# Patient Record
Sex: Male | Born: 2000 | Race: Black or African American | Hispanic: No | Marital: Single | Smoking: Never smoker
Health system: Southern US, Community
[De-identification: ages and names within clinical notes are randomized; demographics above are authoritative.]

## PROBLEM LIST (undated history)

## (undated) DIAGNOSIS — R51 Headache: Secondary | ICD-10-CM

## (undated) HISTORY — PX: CIRCUMCISION: SUR203

## (undated) HISTORY — DX: Headache: R51

---
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
## 2018-08-15 ENCOUNTER — Emergency Department: Payer: Medicaid Other

## 2018-08-15 ENCOUNTER — Emergency Department
Admission: EM | Admit: 2018-08-15 | Discharge: 2018-08-15 | Disposition: A | Discharge status: Home / Self Care | Payer: Medicaid Other | Attending: Emergency Medicine | Admitting: Emergency Medicine

## 2018-08-15 ENCOUNTER — Encounter: Payer: Self-pay | Admitting: Emergency Medicine

## 2018-08-15 ENCOUNTER — Other Ambulatory Visit: Payer: Self-pay

## 2018-08-15 DIAGNOSIS — Y999 Unspecified external cause status: Secondary | ICD-10-CM | POA: Insufficient documentation

## 2018-08-15 DIAGNOSIS — S61212A Laceration without foreign body of right middle finger without damage to nail, initial encounter: Secondary | ICD-10-CM | POA: Insufficient documentation

## 2018-08-15 DIAGNOSIS — S6991XA Unspecified injury of right wrist, hand and finger(s), initial encounter: Secondary | ICD-10-CM | POA: Diagnosis present

## 2018-08-15 DIAGNOSIS — W230XXA Caught, crushed, jammed, or pinched between moving objects, initial encounter: Secondary | ICD-10-CM | POA: Insufficient documentation

## 2018-08-15 DIAGNOSIS — Y939 Activity, unspecified: Secondary | ICD-10-CM | POA: Insufficient documentation

## 2018-08-15 DIAGNOSIS — S60031A Contusion of right middle finger without damage to nail, initial encounter: Secondary | ICD-10-CM | POA: Diagnosis not present

## 2018-08-15 DIAGNOSIS — Y929 Unspecified place or not applicable: Secondary | ICD-10-CM | POA: Insufficient documentation

## 2018-08-15 MED ORDER — IBUPROFEN 600 MG PO TABS
600.0000 mg | ORAL_TABLET | Freq: Once | ORAL | Status: AC
  Administered 2018-08-15: 600 mg via ORAL
  Filled 2018-08-15: qty 1

## 2018-08-15 MED ORDER — HYDROCODONE-ACETAMINOPHEN 5-325 MG PO TABS: 1.0000 | ORAL_TABLET | Freq: Four times a day (QID) | ORAL | 0 refills | Status: AC | PRN

## 2018-08-15 MED ORDER — IBUPROFEN 600 MG PO TABS: 600.0000 mg | ORAL_TABLET | Freq: Three times a day (TID) | ORAL | 0 refills | Status: AC | PRN

## 2018-08-15 MED ORDER — HYDROCODONE-ACETAMINOPHEN 5-325 MG PO TABS
1.0000 | ORAL_TABLET | Freq: Once | ORAL | Status: AC
Start: 2018-08-15 — End: 2018-08-15
  Administered 2018-08-15: 1 via ORAL
  Filled 2018-08-15: qty 1

## 2018-08-15 NOTE — Discharge Instructions (Addendum)
1.  You may take pain medicines as needed (Motrin/Norco #15). 2.  Keep wound clean and dry.  You may remove finger splint as needed. 3.  Your tetanus has been updated and will be good for another 10 years. 4.  Return to the ER for worsening symptoms, increased redness/swelling, purulent discharge or other concerns.

## 2018-08-15 NOTE — ED Notes (Signed)
Pt verbalized understanding of d/c instructions, Rx's, and f/u care. No further questions at this time. E-signature not working at this time. Ambulatory to exit with steady gait.

## 2018-08-15 NOTE — ED Triage Notes (Signed)
Patient ambulatory to triage with steady gait, without difficulty or distress noted; pt reports shut right middle finger in door PTA; approx 3/4" lac noted to pad of finger with small amount bleeding

## 2018-08-15 NOTE — ED Notes (Signed)
R finger wound cleansed per order; dressing applied; splint applied per order during down time. See paper chart.

## 2018-08-15 NOTE — ED Provider Notes (Addendum)
-----------------------------------------   12:59 AM on 08/15/2018 -----------------------------------------  Patient seen during epic computer downtime.  Please see paper chart for documentation.   Irean HongSung, Janayah Zavada J, MD 08/15/18 0059   ----------------------------------------- 3:00 AM on 08/15/2018 -----------------------------------------  Delay secondary to computer downtime.  X-ray negative for acute fracture.  Strict return precautions given.  Patient verbalizes understanding and agrees with plan of care.   Irean HongSung, Montrel Donahoe J, MD 08/15/18 [phone]

## 2019-08-19 ENCOUNTER — Inpatient Hospital Stay (HOSPITAL_COMMUNITY): Payer: Medicaid Other | Admitting: Anesthesiology

## 2019-08-19 ENCOUNTER — Encounter (HOSPITAL_COMMUNITY): Admission: EM | Disposition: A | Discharge status: Home / Self Care | Payer: Self-pay | Source: Home / Self Care

## 2019-08-19 ENCOUNTER — Other Ambulatory Visit: Payer: Self-pay

## 2019-08-19 ENCOUNTER — Emergency Department (HOSPITAL_COMMUNITY): Payer: Medicaid Other

## 2019-08-19 ENCOUNTER — Inpatient Hospital Stay (HOSPITAL_COMMUNITY)
Admission: EM | Admit: 2019-08-19 | Discharge: 2019-08-25 | DRG: 958 | Disposition: A | Discharge status: Home / Self Care | Payer: Medicaid Other | Attending: Surgery | Admitting: Surgery

## 2019-08-19 ENCOUNTER — Encounter (HOSPITAL_COMMUNITY): Payer: Self-pay

## 2019-08-19 ENCOUNTER — Inpatient Hospital Stay (HOSPITAL_COMMUNITY): Payer: Medicaid Other

## 2019-08-19 DIAGNOSIS — D62 Acute posthemorrhagic anemia: Secondary | ICD-10-CM | POA: Diagnosis present

## 2019-08-19 DIAGNOSIS — Z23 Encounter for immunization: Secondary | ICD-10-CM

## 2019-08-19 DIAGNOSIS — S143XXA Injury of brachial plexus, initial encounter: Secondary | ICD-10-CM | POA: Diagnosis present

## 2019-08-19 DIAGNOSIS — J9690 Respiratory failure, unspecified, unspecified whether with hypoxia or hypercapnia: Secondary | ICD-10-CM

## 2019-08-19 DIAGNOSIS — S21132A Puncture wound without foreign body of left front wall of thorax without penetration into thoracic cavity, initial encounter: Secondary | ICD-10-CM | POA: Diagnosis present

## 2019-08-19 DIAGNOSIS — S271XXA Traumatic hemothorax, initial encounter: Secondary | ICD-10-CM

## 2019-08-19 DIAGNOSIS — Z4682 Encounter for fitting and adjustment of non-vascular catheter: Secondary | ICD-10-CM

## 2019-08-19 DIAGNOSIS — Z20828 Contact with and (suspected) exposure to other viral communicable diseases: Secondary | ICD-10-CM | POA: Diagnosis present

## 2019-08-19 DIAGNOSIS — S2242XA Multiple fractures of ribs, left side, initial encounter for closed fracture: Secondary | ICD-10-CM | POA: Diagnosis present

## 2019-08-19 DIAGNOSIS — S81032A Puncture wound without foreign body, left knee, initial encounter: Secondary | ICD-10-CM | POA: Diagnosis present

## 2019-08-19 DIAGNOSIS — J939 Pneumothorax, unspecified: Secondary | ICD-10-CM

## 2019-08-19 DIAGNOSIS — S27391A Other injuries of lung, unilateral, initial encounter: Secondary | ICD-10-CM

## 2019-08-19 DIAGNOSIS — S3133XA Puncture wound without foreign body of scrotum and testes, initial encounter: Secondary | ICD-10-CM | POA: Diagnosis present

## 2019-08-19 DIAGNOSIS — M25522 Pain in left elbow: Secondary | ICD-10-CM

## 2019-08-19 DIAGNOSIS — S272XXA Traumatic hemopneumothorax, initial encounter: Secondary | ICD-10-CM | POA: Diagnosis present

## 2019-08-19 DIAGNOSIS — S1193XA Puncture wound without foreign body of unspecified part of neck, initial encounter: Secondary | ICD-10-CM | POA: Diagnosis present

## 2019-08-19 DIAGNOSIS — T1490XA Injury, unspecified, initial encounter: Secondary | ICD-10-CM | POA: Diagnosis present

## 2019-08-19 DIAGNOSIS — J942 Hemothorax: Secondary | ICD-10-CM

## 2019-08-19 DIAGNOSIS — S71131A Puncture wound without foreign body, right thigh, initial encounter: Secondary | ICD-10-CM

## 2019-08-19 DIAGNOSIS — W3400XA Accidental discharge from unspecified firearms or gun, initial encounter: Secondary | ICD-10-CM

## 2019-08-19 DIAGNOSIS — Z9689 Presence of other specified functional implants: Secondary | ICD-10-CM

## 2019-08-19 HISTORY — PX: SCROTAL EXPLORATION: SHX2386

## 2019-08-19 HISTORY — PX: CYSTOSCOPY: SHX5120

## 2019-08-19 HISTORY — PX: CHEST TUBE INSERTION: SHX231

## 2019-08-19 HISTORY — PX: ORCHIECTOMY: SHX2116

## 2019-08-19 LAB — CBC
HCT: 30 % — ABNORMAL LOW (ref 39.0–52.0)
HCT: 31.7 % — ABNORMAL LOW (ref 39.0–52.0)
Hemoglobin: 10 g/dL — ABNORMAL LOW (ref 13.0–17.0)
Hemoglobin: 11.2 g/dL — ABNORMAL LOW (ref 13.0–17.0)
MCH: 31.1 pg (ref 26.0–34.0)
MCH: 30.9 pg (ref 26.0–34.0)
MCHC: 33.3 g/dL (ref 30.0–36.0)
MCHC: 35.3 g/dL (ref 30.0–36.0)
MCV: 93.2 fL (ref 80.0–100.0)
MCV: 87.3 fL (ref 80.0–100.0)
Platelets: 160 10*3/uL (ref 150–400)
Platelets: 142 10*3/uL — ABNORMAL LOW (ref 150–400)
RBC: 3.22 MIL/uL — ABNORMAL LOW (ref 4.22–5.81)
RBC: 3.63 MIL/uL — ABNORMAL LOW (ref 4.22–5.81)
RDW: 13 % (ref 11.5–15.5)
RDW: 13.1 % (ref 11.5–15.5)
WBC: 15 10*3/uL — ABNORMAL HIGH (ref 4.0–10.5)
WBC: 9.1 10*3/uL (ref 4.0–10.5)
nRBC: 0 % (ref 0.0–0.2)
nRBC: 0 % (ref 0.0–0.2)

## 2019-08-19 LAB — I-STAT CHEM 8, ED
BUN: 16 mg/dL (ref 6–20)
Calcium, Ion: 0.8 mmol/L — CL (ref 1.15–1.40)
Chloride: 106 mmol/L (ref 98–111)
Creatinine, Ser: 0.7 mg/dL (ref 0.61–1.24)
Glucose, Bld: 151 mg/dL — ABNORMAL HIGH (ref 70–99)
HCT: 27 % — ABNORMAL LOW (ref 39.0–52.0)
Hemoglobin: 9.2 g/dL — ABNORMAL LOW (ref 13.0–17.0)
Potassium: 3.3 mmol/L — ABNORMAL LOW (ref 3.5–5.1)
Sodium: 143 mmol/L (ref 135–145)
TCO2: 20 mmol/L — ABNORMAL LOW (ref 22–32)

## 2019-08-19 LAB — COMPREHENSIVE METABOLIC PANEL
ALT: 10 U/L (ref 0–44)
AST: 17 U/L (ref 15–41)
Albumin: 2.5 g/dL — ABNORMAL LOW (ref 3.5–5.0)
Alkaline Phosphatase: 37 U/L — ABNORMAL LOW (ref 38–126)
Anion gap: 21 — ABNORMAL HIGH (ref 5–15)
BUN: 15 mg/dL (ref 6–20)
CO2: 8 mmol/L — ABNORMAL LOW (ref 22–32)
Calcium: 7.2 mg/dL — ABNORMAL LOW (ref 8.9–10.3)
Chloride: 113 mmol/L — ABNORMAL HIGH (ref 98–111)
Creatinine, Ser: 0.86 mg/dL (ref 0.61–1.24)
GFR calc Af Amer: >60 mL/min (ref >60)
GFR calc non Af Amer: >60 mL/min (ref >60)
Glucose, Bld: 155 mg/dL — ABNORMAL HIGH (ref 70–99)
Potassium: 3.4 mmol/L — ABNORMAL LOW (ref 3.5–5.1)
Sodium: 142 mmol/L (ref 135–145)
Total Bilirubin: 0.7 mg/dL (ref 0.3–1.2)
Total Protein: 4 g/dL — ABNORMAL LOW (ref 6.5–8.1)

## 2019-08-19 LAB — URINALYSIS, ROUTINE W REFLEX MICROSCOPIC
Bacteria, UA: NONE SEEN
Bilirubin Urine: NEGATIVE
Glucose, UA: NEGATIVE mg/dL
Ketones, ur: NEGATIVE mg/dL
Leukocytes,Ua: NEGATIVE
Nitrite: NEGATIVE
Protein, ur: NEGATIVE mg/dL
Specific Gravity, Urine: 1.029 (ref 1.005–1.030)
pH: 5 (ref 5.0–8.0)

## 2019-08-19 LAB — POCT I-STAT 7, (LYTES, BLD GAS, ICA,H+H)
Acid-base deficit: 1 mmol/L (ref 0.0–2.0)
Bicarbonate: 24.7 mmol/L (ref 20.0–28.0)
Calcium, Ion: 1.2 mmol/L (ref 1.15–1.40)
HCT: 29 % — ABNORMAL LOW (ref 39.0–52.0)
Hemoglobin: 9.9 g/dL — ABNORMAL LOW (ref 13.0–17.0)
O2 Saturation: 100 %
Patient temperature: 35
Potassium: 4 mmol/L (ref 3.5–5.1)
Sodium: 140 mmol/L (ref 135–145)
TCO2: 26 mmol/L (ref 22–32)
pCO2 arterial: 40 mmHg (ref 32.0–48.0)
pH, Arterial: 7.389 (ref 7.350–7.450)
pO2, Arterial: 547 mmHg — ABNORMAL HIGH (ref 83.0–108.0)

## 2019-08-19 LAB — PROTIME-INR
INR: 1.3 — ABNORMAL HIGH (ref 0.8–1.2)
Prothrombin Time: 16.3 seconds — ABNORMAL HIGH (ref 11.4–15.2)

## 2019-08-19 LAB — ABO/RH: ABO/RH(D): B POS

## 2019-08-19 LAB — CDS SEROLOGY

## 2019-08-19 LAB — ETHANOL: Alcohol, Ethyl (B): <10 mg/dL (ref <10)

## 2019-08-19 LAB — RESPIRATORY PANEL BY RT PCR (FLU A&B, COVID)
Influenza A by PCR: NEGATIVE
Influenza B by PCR: NEGATIVE
SARS Coronavirus 2 by RT PCR: NEGATIVE

## 2019-08-19 LAB — LACTIC ACID, PLASMA: Lactic Acid, Venous: 3.7 mmol/L (ref 0.5–1.9)

## 2019-08-19 LAB — HIV ANTIBODY (ROUTINE TESTING W REFLEX): HIV Screen 4th Generation wRfx: NONREACTIVE

## 2019-08-19 LAB — MRSA PCR SCREENING: MRSA by PCR: NEGATIVE

## 2019-08-19 SURGERY — EXPLORATION, SCROTUM
Anesthesia: General | Site: Scrotum | Laterality: Right

## 2019-08-19 SURGERY — EXCISION, CYST, NECK
Anesthesia: General | Site: Neck | Laterality: Left

## 2019-08-19 MED ORDER — FENTANYL CITRATE (PF) 100 MCG/2ML IJ SOLN: 25.0000 ug | INTRAMUSCULAR | Status: DC | PRN

## 2019-08-19 MED ORDER — FENTANYL CITRATE (PF) 100 MCG/2ML IJ SOLN
INTRAMUSCULAR | Status: AC | PRN
  Administered 2019-08-19: 50 ug via INTRAVENOUS

## 2019-08-19 MED ORDER — MIDAZOLAM HCL 2 MG/2ML IJ SOLN
INTRAMUSCULAR | Status: AC
  Filled 2019-08-19: qty 2

## 2019-08-19 MED ORDER — SODIUM CHLORIDE 0.45 % IV SOLN: INTRAVENOUS | Status: DC

## 2019-08-19 MED ORDER — MIDAZOLAM HCL 5 MG/5ML IJ SOLN
INTRAMUSCULAR | Status: DC | PRN
  Administered 2019-08-19: 2 mg via INTRAVENOUS

## 2019-08-19 MED ORDER — SUCCINYLCHOLINE CHLORIDE 200 MG/10ML IV SOSY
PREFILLED_SYRINGE | INTRAVENOUS | Status: DC | PRN
  Administered 2019-08-19: 80 mg via INTRAVENOUS

## 2019-08-19 MED ORDER — DOCUSATE SODIUM 100 MG PO CAPS
100.0000 mg | ORAL_CAPSULE | Freq: Two times a day (BID) | ORAL | Status: DC
  Administered 2019-08-19 – 2019-08-25 (×12): 100 mg via ORAL
  Filled 2019-08-19 (×12): qty 1

## 2019-08-19 MED ORDER — MORPHINE SULFATE (PF) 2 MG/ML IV SOLN
2.0000 mg | INTRAVENOUS | Status: DC | PRN
  Administered 2019-08-19 – 2019-08-20 (×2): 2 mg via INTRAVENOUS
  Filled 2019-08-19 (×2): qty 1

## 2019-08-19 MED ORDER — FENTANYL CITRATE (PF) 100 MCG/2ML IJ SOLN
INTRAMUSCULAR | Status: AC
  Filled 2019-08-19: qty 2

## 2019-08-19 MED ORDER — CEFAZOLIN SODIUM-DEXTROSE 2-4 GM/100ML-% IV SOLN
2.0000 g | Freq: Once | INTRAVENOUS | Status: AC
  Administered 2019-08-19: 2 g via INTRAVENOUS

## 2019-08-19 MED ORDER — PROPOFOL 10 MG/ML IV BOLUS
INTRAVENOUS | Status: DC | PRN
  Administered 2019-08-19: 120 mg via INTRAVENOUS

## 2019-08-19 MED ORDER — FENTANYL CITRATE (PF) 250 MCG/5ML IJ SOLN
INTRAMUSCULAR | Status: AC
  Filled 2019-08-19: qty 5

## 2019-08-19 MED ORDER — IOHEXOL 350 MG/ML SOLN
100.0000 mL | Freq: Once | INTRAVENOUS | Status: AC | PRN
  Administered 2019-08-19: 100 mL via INTRAVENOUS

## 2019-08-19 MED ORDER — DEXAMETHASONE SODIUM PHOSPHATE 10 MG/ML IJ SOLN
INTRAMUSCULAR | Status: DC | PRN
  Administered 2019-08-19: 4 mg via INTRAVENOUS

## 2019-08-19 MED ORDER — METHOCARBAMOL 750 MG PO TABS
750.0000 mg | ORAL_TABLET | Freq: Three times a day (TID) | ORAL | Status: DC | PRN
  Administered 2019-08-19: 750 mg via ORAL
  Filled 2019-08-19: qty 1

## 2019-08-19 MED ORDER — LACTATED RINGERS IV SOLN: INTRAVENOUS | Status: DC | PRN

## 2019-08-19 MED ORDER — CALCIUM GLUCONATE-NACL 2-0.675 GM/100ML-% IV SOLN
2.0000 g | Freq: Once | INTRAVENOUS | Status: AC
  Administered 2019-08-19: 2000 mg via INTRAVENOUS
  Filled 2019-08-19: qty 100

## 2019-08-19 MED ORDER — LIDOCAINE HCL (PF) 2 % IJ SOLN
INTRAMUSCULAR | Status: AC
  Filled 2019-08-19: qty 5

## 2019-08-19 MED ORDER — ROCURONIUM BROMIDE 10 MG/ML (PF) SYRINGE
PREFILLED_SYRINGE | INTRAVENOUS | Status: DC | PRN
  Administered 2019-08-19: 50 mg via INTRAVENOUS

## 2019-08-19 MED ORDER — FENTANYL CITRATE (PF) 250 MCG/5ML IJ SOLN
INTRAMUSCULAR | Status: DC | PRN
  Administered 2019-08-19 (×4): 50 ug via INTRAVENOUS
  Administered 2019-08-19: 100 ug via INTRAVENOUS

## 2019-08-19 MED ORDER — ONDANSETRON HCL 4 MG/2ML IJ SOLN
INTRAMUSCULAR | Status: DC | PRN
  Administered 2019-08-19: 4 mg via INTRAVENOUS

## 2019-08-19 MED ORDER — OXYCODONE HCL 5 MG/5ML PO SOLN: 5.0000 mg | ORAL | Status: DC | PRN

## 2019-08-19 MED ORDER — 0.9 % SODIUM CHLORIDE (POUR BTL) OPTIME
TOPICAL | Status: DC | PRN
  Administered 2019-08-19: 1000 mL

## 2019-08-19 MED ORDER — PROMETHAZINE HCL 25 MG/ML IJ SOLN: 6.2500 mg | INTRAMUSCULAR | Status: DC | PRN

## 2019-08-19 MED ORDER — STERILE WATER FOR IRRIGATION IR SOLN
Status: DC | PRN
  Administered 2019-08-19 (×2): 3000 mL

## 2019-08-19 MED ORDER — ACETAMINOPHEN 325 MG PO TABS: 650.0000 mg | ORAL_TABLET | Freq: Four times a day (QID) | ORAL | Status: DC | PRN

## 2019-08-19 MED ORDER — ONDANSETRON HCL 4 MG/2ML IJ SOLN: 4.0000 mg | Freq: Four times a day (QID) | INTRAMUSCULAR | Status: DC | PRN

## 2019-08-19 MED ORDER — CEFAZOLIN SODIUM-DEXTROSE 2-3 GM-%(50ML) IV SOLR
INTRAVENOUS | Status: DC | PRN
  Administered 2019-08-19: 2 g via INTRAVENOUS

## 2019-08-19 MED ORDER — SODIUM CHLORIDE 0.9 % IV SOLN: INTRAVENOUS | Status: DC | PRN

## 2019-08-19 MED ORDER — TETANUS-DIPHTH-ACELL PERTUSSIS 5-2.5-18.5 LF-MCG/0.5 IM SUSP
0.5000 mL | Freq: Once | INTRAMUSCULAR | Status: AC
  Administered 2019-08-19: 0.5 mL via INTRAMUSCULAR

## 2019-08-19 MED ORDER — DEXMEDETOMIDINE HCL 200 MCG/2ML IV SOLN
INTRAVENOUS | Status: DC | PRN
  Administered 2019-08-19 (×2): 20 ug via INTRAVENOUS

## 2019-08-19 MED ORDER — ONDANSETRON 4 MG PO TBDP: 4.0000 mg | ORAL_TABLET | Freq: Four times a day (QID) | ORAL | Status: DC | PRN

## 2019-08-19 MED ORDER — SUGAMMADEX SODIUM 200 MG/2ML IV SOLN
INTRAVENOUS | Status: DC | PRN
  Administered 2019-08-19: 20 mg via INTRAVENOUS

## 2019-08-19 MED ORDER — PROPOFOL 10 MG/ML IV BOLUS
INTRAVENOUS | Status: AC
  Filled 2019-08-19: qty 40

## 2019-08-19 MED ORDER — FENTANYL CITRATE (PF) 100 MCG/2ML IJ SOLN
100.0000 ug | Freq: Once | INTRAMUSCULAR | Status: AC
  Administered 2019-08-19: 100 ug via INTRAVENOUS
  Filled 2019-08-19: qty 2

## 2019-08-19 MED ORDER — CHLORHEXIDINE GLUCONATE CLOTH 2 % EX PADS
6.0000 | MEDICATED_PAD | Freq: Every day | CUTANEOUS | Status: DC
  Administered 2019-08-19 – 2019-08-24 (×6): 6 via TOPICAL

## 2019-08-19 MED ORDER — ALBUMIN HUMAN 5 % IV SOLN: INTRAVENOUS | Status: DC | PRN

## 2019-08-19 MED ORDER — POTASSIUM CHLORIDE IN NACL 20-0.45 MEQ/L-% IV SOLN
INTRAVENOUS | Status: DC
  Filled 2019-08-19 (×3): qty 1000

## 2019-08-19 MED ORDER — LIDOCAINE 2% (20 MG/ML) 5 ML SYRINGE
INTRAMUSCULAR | Status: DC | PRN
  Administered 2019-08-19: 60 mg via INTRAVENOUS

## 2019-08-19 MED ORDER — OXYCODONE HCL 5 MG PO TABS
5.0000 mg | ORAL_TABLET | ORAL | Status: DC | PRN
  Administered 2019-08-19: 5 mg via ORAL
  Administered 2019-08-19: 10 mg via ORAL
  Administered 2019-08-19: 5 mg via ORAL
  Administered 2019-08-20 – 2019-08-21 (×7): 10 mg via ORAL
  Administered 2019-08-22: 5 mg via ORAL
  Administered 2019-08-22: 10 mg via ORAL
  Filled 2019-08-19 (×8): qty 2
  Filled 2019-08-19 (×2): qty 1
  Filled 2019-08-19 (×3): qty 2

## 2019-08-19 MED ORDER — POTASSIUM CHLORIDE 10 MEQ/100ML IV SOLN
10.0000 meq | INTRAVENOUS | Status: AC
  Administered 2019-08-19 (×3): 10 meq via INTRAVENOUS
  Filled 2019-08-19 (×3): qty 100

## 2019-08-19 SURGICAL SUPPLY — 34 items
BAG URO CATCHER STRL LF (MISCELLANEOUS) IMPLANT
BLADE SURG 15 STRL LF DISP TIS (BLADE) ×6 IMPLANT
BLADE SURG 15 STRL SS (BLADE) ×4
CANISTER SUCT 3000ML PPV (MISCELLANEOUS) ×5 IMPLANT
CONT SPEC 4OZ CLIKSEAL STRL BL (MISCELLANEOUS) ×5 IMPLANT
COVER WAND RF STERILE (DRAPES) ×5 IMPLANT
DRAIN PENROSE 18X1/4 LTX STRL (DRAIN) ×5 IMPLANT
DRAPE LAPAROTOMY 100X72 PEDS (DRAPES) ×5 IMPLANT
DRSG PAD ABDOMINAL 8X10 ST (GAUZE/BANDAGES/DRESSINGS) ×10 IMPLANT
DRSG XEROFORM 1X8 (GAUZE/BANDAGES/DRESSINGS) ×10 IMPLANT
ELECT REM PT RETURN 9FT ADLT (ELECTROSURGICAL) ×5
ELECTRODE REM PT RTRN 9FT ADLT (ELECTROSURGICAL) ×3 IMPLANT
GAUZE SPONGE 4X4 12PLY STRL (GAUZE/BANDAGES/DRESSINGS) ×15 IMPLANT
GLOVE ORTHO TXT STRL SZ7.5 (GLOVE) ×5 IMPLANT
KIT BASIN OR (CUSTOM PROCEDURE TRAY) ×5 IMPLANT
KIT TURNOVER KIT B (KITS) ×5 IMPLANT
NS IRRIG 1000ML POUR BTL (IV SOLUTION) ×5 IMPLANT
PACK GENERAL/GYN (CUSTOM PROCEDURE TRAY) ×5 IMPLANT
PACK LITHOTOMY IV (CUSTOM PROCEDURE TRAY) ×5 IMPLANT
PAD ARMBOARD 7.5X6 YLW CONV (MISCELLANEOUS) ×10 IMPLANT
SET CYSTO W/LG BORE CLAMP LF (SET/KITS/TRAYS/PACK) ×5 IMPLANT
SOL PREP POV-IOD 4OZ 10% (MISCELLANEOUS) ×5 IMPLANT
SPONGE LAP 18X18 RF (DISPOSABLE) IMPLANT
SUT CHROMIC 3 0 SH 27 (SUTURE) ×5 IMPLANT
SUT VIC AB 0 CT2 27 (SUTURE) ×10 IMPLANT
SWAB COLLECTION DEVICE MRSA (MISCELLANEOUS) IMPLANT
SYSTEM SAHARA CHEST DRAIN ATS (WOUND CARE) ×5 IMPLANT
TAPE CLOTH SURG 6X10 WHT LF (GAUZE/BANDAGES/DRESSINGS) ×5 IMPLANT
TOWEL GREEN STERILE (TOWEL DISPOSABLE) ×5 IMPLANT
TOWEL GREEN STERILE FF (TOWEL DISPOSABLE) ×5 IMPLANT
TRAY CHEST TUBE INSERTION DISP (SET/KITS/TRAYS/PACK) ×5 IMPLANT
TUBE CONNECTING 20'X1/4 (TUBING) ×2
TUBE CONNECTING 20X1/4 (TUBING) ×8 IMPLANT
WATER STERILE IRR 1000ML POUR (IV SOLUTION) ×5 IMPLANT

## 2019-08-19 NOTE — Op Note (Signed)
   Procedure Note  Date: 08/19/2019  Procedure: tube thoracostomy--left    Pre-op diagnosis: left retained hemothorax  Post-op diagnosis: same  Surgeon: Jesusita Oka, MD  Anesthesia: general  EBL: <5cc procedural; 40cc blood evacuated Drains/Implants: 79F chest tube Specimen: none  Description of procedure: Time-out was performed verifying correct patient, procedure, site, laterality, and signature of informed consent. A longitudinal incision was made parallel to the rib at the fifth intercostal space. This incision was deepened down through the muscle until the pleural cavity was entered. Blood was encountered upon entry and a 79F chest tube was inserted through this tract and directed inferiorly.   The tube was secured at 12cm at the skin with suture and connected to an atrium at -20cm water wall suction. Immediate output from the chest tube was 40cc and was bloody. The site was dressed with xeroform, gauze, and tape. The patient tolerated the procedure well. There were no complications. Follow up chest x-ray was ordered to confirm tube positioning, complete evacuation, and complete lung re-expansion.    Jesusita Oka, MD General and [facility]

## 2019-08-19 NOTE — Anesthesia Procedure Notes (Signed)
Arterial Line Insertion Start/End12/15/2020 5:07 AM, 08/19/2019 5:09 AM Performed by: Jearld Pies, CRNA, CRNA  Patient location: OR. Emergency situation Left, radial was placed Catheter size: 20 G Hand hygiene performed  and Seldinger technique used Allen's test indicative of satisfactory collateral circulation Attempts: 1 Procedure performed without using ultrasound guided technique. Following insertion, dressing applied and Biopatch. Post procedure assessment: normal  Patient tolerated the procedure well with no immediate complications.

## 2019-08-19 NOTE — ED Notes (Signed)
BP Improved, BLT Le pulses present at this time per MD [facility]

## 2019-08-19 NOTE — Discharge Instructions (Addendum)
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 underlying lung problem.  You smoke.  You are 33-45 years old, male, tall, and underweight.  You have a personal or family history of pneumothorax.  You have an eating disorder (anorexia nervosa). This condition can also happen quickly, even in people with no history of lung problems. What are the signs or symptoms? Sometimes a pneumothorax will have no symptoms. When symptoms are present, they can include:  Chest pain.  Shortness of breath.  Increased rate of breathing.  Bluish color to your lips or skin (cyanosis). How is this diagnosed? This condition may be diagnosed by:  A medical history and physical exam.  A chest X-ray, chest CT scan,  or  ultrasound. How is this treated? Treatment depends on how severe your condition is. The goal of treatment is to remove the extra air and allow your lung to expand back to its normal size.  For a small pneumothorax: ? No treatment may be needed. ? Extra oxygen is sometimes used to make it go away more quickly.  For a large pneumothorax or a pneumothorax that is causing symptoms, a procedure is done to drain the air from your lungs. To do this, a health care provider may use: ? A needle with a syringe. This is used to suck air from a pleural space where no additional leakage is taking place. ? A chest tube. This is used to suck air where there is ongoing leakage into the pleural space. The chest tube may need to remain in place for several days until the air leak has healed.  In more severe cases, surgery may be needed to repair the damage that is causing the leak.  If you have multiple pneumothorax episodes or have an air leak that will not heal, a procedure called a pleurodesis may be done. A medicine is placed in the pleural space to irritate the tissues around the lung so that the lung will stick to the chest wall, seal any leaks, and stop any buildup of air in that space. If you have an underlying lung problem, severe symptoms, or a large pneumothorax you will usually need to stay in the hospital. Follow these instructions at home: Lifestyle  Do not use any products that contain nicotine or tobacco, such as cigarettes and e-cigarettes. These are major risk factors in pneumothorax. If you need help quitting, ask your health care provider.  Do not lift anything that is heavier than 10 lb (4.5 kg), or the limit that your health care provider tells you, until he or she says that it is safe.  Avoid activities that take a lot of effort (strenuous) for as long as told by your health care provider.  Return to your normal activities as told by your health care provider. Ask your health care  provider what activities are safe for you.  Do not fly in an airplane or scuba dive until your health care provider says it is okay. General instructions  Take over-the-counter and prescription medicines only as told by your health care provider.  If a cough or pain makes it difficult for you to sleep at night, try sleeping in a semi-upright position in a recliner or by using 2 or 3 pillows.  If you had a chest tube and it was removed, ask your health care provider when you can remove the bandage (dressing). While the dressing is in place, do not allow it to get wet.  Keep all follow-up visits as told by your health care provider. This is important. Contact a health care provider if:  You cough up thick mucus (sputum) that is yellow or green in color.  You were treated with a chest tube, and you have redness, increasing pain, or discharge at the site where it was placed. Get help right away if:  You have increasing chest pain or shortness of breath.  You have a cough that will not go away.  You begin coughing up blood.  You have pain that is getting worse or is not controlled with medicines.  The site where your chest tube was located opens up.  You feel air coming out of the site where the chest tube was placed.  You have a fever or persistent symptoms for more than 2-3 days.  You have a fever and your symptoms suddenly get worse. These symptoms may represent a serious problem that is an emergency. Do not wait to see if the symptoms will go away. Get medical help right away. Call your local emergency services (911 in the [place] Do not drive yourself to the hospital. Summary  A pneumothorax, commonly called a collapsed lung, is a condition in which air leaks from a lung and gets trapped between the lung and the chest wall (pleural space).  The buildup of air may be small or large. A small pneumothorax may go away on its own. A large pneumothorax will require treatment and  hospitalization.  Treatment for this condition depends on how severe the pneumothorax is. The goal of treatment is to remove the extra air and allow the lung to expand back to its normal size. This information is not intended to replace advice given to you by your health care provider. Make sure you discuss any questions you have with your health care provider. Document Released: 08/21/2005 Document Revised: 07/30/2017 Document Reviewed: 07/30/2017 Elsevier Interactive Patient Education  [employer].  Please make sure to keep your appointments with occupational therapy to help strengthen your left arm. If this does not improve in 12 weeks then you may need to see a specialist (Dr. Dierdre Searles at [facility])
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
## 2019-08-19 NOTE — ED Notes (Signed)
2G ANCEF INFUSING
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
## 2019-08-19 NOTE — Anesthesia Procedure Notes (Signed)
Procedure Name: Intubation Date/Time: 08/19/2019 5:09 AM Performed by: Jearld Pies, CRNA Pre-anesthesia Checklist: Patient identified, Emergency Drugs available, Suction available and Patient being monitored Patient Re-evaluated:Patient Re-evaluated prior to induction Oxygen Delivery Method: Circle System Utilized Preoxygenation: Pre-oxygenation with 100% oxygen Induction Type: IV induction and Rapid sequence Laryngoscope Size: Glidescope and 3 Grade View: Grade I Tube type: Oral Tube size: 8.0 mm Number of attempts: 1 Airway Equipment and Method: Stylet and Oral airway Placement Confirmation: ETT inserted through vocal cords under direct vision,  positive ETCO2 and breath sounds checked- equal and bilateral Secured at: 23 cm Tube secured with: Tape Dental Injury: Teeth and Oropharynx as per pre-operative assessment  Comments: Glidescope utilized d/t C-spine precautions. Aspen collar maintained during intubation.
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
## 2019-08-19 NOTE — ED Notes (Signed)
PT arrives w/ pressure 60 palp, unobtainable doppler pulses in blt lower extremities per Lovick.

## 2019-08-19 NOTE — ED Notes (Signed)
MD Lovick performinhg chest tube to L side at this time for PNX
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
## 2019-08-19 NOTE — Progress Notes (Signed)
I responded to a request from the nurse to provide spiritual support for the patient's family. I was present with the medical staff member when she gave medical updates to the family. I shared words of comfort and encouragement. I shared that the Chaplain is available to provide additional support as needed or requested.    08/19/19 0400  Clinical Encounter Type  Visited With Family;Patient not available  Visit Type Spiritual support;ED;Trauma  Referral From Nurse  Consult/Referral To Chaplain  Spiritual Encounters  Spiritual Needs Prayer    Chaplain Dr Redgie Grayer
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
## 2019-08-19 NOTE — ED Notes (Signed)
TO CT W/ THIS RN AND MD Bobbye Morton
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
## 2019-08-19 NOTE — ED Notes (Signed)
Chem-8 results reported to Dr. Bobbye Morton

## 2019-08-19 NOTE — Progress Notes (Signed)
TCTS consulted around 0215 due to CT o/p of 1.2L immediately after return from CT (output was 750cc prior to CT) coupled with retained HTX identified on CT imaging. Recommendation at that time was for repeat CXR, which was performed. At 0400, CT o/p was >1.5L and TCTS notified again of output and also that patient would be going to surgery urgently with urology for testicular rupture. Discussion held regarding the indication for second chest tube placement under anesthesia for surgery and this was declined due to thoracic surgery attending review of repeat CXR and determination of improvement of HTX. Recommendations at this time from TCTS are for continued monitoring of chest tube output and repeat CXR.   Jesusita Oka, MD General and [facility]

## 2019-08-19 NOTE — Transfer of Care (Signed)
Immediate Anesthesia Transfer of Care Note  Patient: Travonne Schowalter.  Procedure(s) Performed: SCROTUM EXPLORATION (Right Scrotum) Cystoscopy Flexible (N/A Bladder) Right Orchiectomy (Right Scrotum) Chest Tube Insertion (Left Chest)  Patient Location: PACU  Anesthesia Type:General  Level of Consciousness: sedated and drowsy  Airway & Oxygen Therapy: Patient Spontanous Breathing and Patient connected to face mask oxygen  Post-op Assessment: Report given to RN and Post -op Vital signs reviewed and stable  Post vital signs: Reviewed and stable  Last Vitals:  Vitals Value Taken Time  BP 111/59 08/19/19 0648  Temp    Pulse 82 08/19/19 0648  Resp 19 08/19/19 0648  SpO2 100 % 08/19/19 0648  Vitals shown include unvalidated device data.  Last Pain:  Vitals:   08/19/19 0433  TempSrc:   PainSc: 9          Complications: No apparent anesthesia complications

## 2019-08-19 NOTE — OR Nursing (Signed)
Cervical collar in place on arrival to OR.Vincent Li
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
## 2019-08-19 NOTE — Anesthesia Postprocedure Evaluation (Signed)
Anesthesia Post Note  Patient: Kavari Parrillo.  Procedure(s) Performed: SCROTUM EXPLORATION (Right Scrotum) Cystoscopy Flexible (N/A Bladder) Right Orchiectomy (Right Scrotum) Chest Tube Insertion (Left Chest)     Patient location during evaluation: PACU Anesthesia Type: General Level of consciousness: awake and alert Pain management: pain level controlled Vital Signs Assessment: post-procedure vital signs reviewed and stable Respiratory status: spontaneous breathing, nonlabored ventilation, respiratory function stable and patient connected to nasal cannula oxygen Cardiovascular status: blood pressure returned to baseline and stable Postop Assessment: no apparent nausea or vomiting Anesthetic complications: no    Last Vitals:  Vitals:   08/19/19 0718 08/19/19 0731  BP: 113/72 108/81  Pulse:  67  Resp: (!) 22 18  Temp:  (!) 36.4 C  SpO2:  100%    Last Pain:  Vitals:   08/19/19 0648  TempSrc:   PainSc: Tyler Deis

## 2019-08-20 ENCOUNTER — Inpatient Hospital Stay (HOSPITAL_COMMUNITY): Payer: Medicaid Other

## 2019-08-20 LAB — PREPARE FRESH FROZEN PLASMA
Unit division: 0
Unit division: 0
Unit division: 0
Unit division: 0
Unit division: 0
Unit division: 0

## 2019-08-20 LAB — BPAM RBC
Blood Product Expiration Date: 202101082359
Blood Product Expiration Date: 202101082359
Blood Product Expiration Date: 202101102359
Blood Product Expiration Date: 202101102359
Blood Product Expiration Date: 202101112359
Blood Product Expiration Date: 202101122359
Blood Product Expiration Date: 202101122359
Blood Product Expiration Date: 202101162359
ISSUE DATE / TIME: 202012150022
ISSUE DATE / TIME: 202012150022
ISSUE DATE / TIME: 202012150101
ISSUE DATE / TIME: 202012150101
ISSUE DATE / TIME: 202012150110
ISSUE DATE / TIME: 202012150110
ISSUE DATE / TIME: 202012151005
ISSUE DATE / TIME: 202012151255
Unit Type and Rh: 5100
Unit Type and Rh: 5100
Unit Type and Rh: 5100
Unit Type and Rh: 5100
Unit Type and Rh: 5100
Unit Type and Rh: 5100
Unit Type and Rh: 9500
Unit Type and Rh: 9500

## 2019-08-20 LAB — TYPE AND SCREEN
ABO/RH(D): B POS
Antibody Screen: NEGATIVE
Unit division: 0
Unit division: 0
Unit division: 0
Unit division: 0
Unit division: 0
Unit division: 0
Unit division: 0
Unit division: 0

## 2019-08-20 LAB — BASIC METABOLIC PANEL
Anion gap: 10 (ref 5–15)
BUN: 9 mg/dL (ref 6–20)
CO2: 25 mmol/L (ref 22–32)
Calcium: 8.8 mg/dL — ABNORMAL LOW (ref 8.9–10.3)
Chloride: 107 mmol/L (ref 98–111)
Creatinine, Ser: 0.82 mg/dL (ref 0.61–1.24)
GFR calc Af Amer: >60 mL/min (ref >60)
GFR calc non Af Amer: >60 mL/min (ref >60)
Glucose, Bld: 101 mg/dL — ABNORMAL HIGH (ref 70–99)
Potassium: 4.5 mmol/L (ref 3.5–5.1)
Sodium: 142 mmol/L (ref 135–145)

## 2019-08-20 LAB — BPAM FFP
Blood Product Expiration Date: 202012172359
Blood Product Expiration Date: 202012172359
Blood Product Expiration Date: 202012172359
Blood Product Expiration Date: 202012172359
Blood Product Expiration Date: 202012172359
Blood Product Expiration Date: 202012172359
Blood Product Expiration Date: 202012172359
ISSUE DATE / TIME: 202012140055
ISSUE DATE / TIME: 202012150053
ISSUE DATE / TIME: 202012150053
ISSUE DATE / TIME: 202012150110
ISSUE DATE / TIME: 202012150110
ISSUE DATE / TIME: 202012150115
ISSUE DATE / TIME: 202012151614
Unit Type and Rh: 6200
Unit Type and Rh: 6200
Unit Type and Rh: 6200
Unit Type and Rh: 6200
Unit Type and Rh: 6200
Unit Type and Rh: 6200
Unit Type and Rh: 6200

## 2019-08-20 LAB — CBC
HCT: 33.8 % — ABNORMAL LOW (ref 39.0–52.0)
Hemoglobin: 11.6 g/dL — ABNORMAL LOW (ref 13.0–17.0)
MCH: 30.3 pg (ref 26.0–34.0)
MCHC: 34.3 g/dL (ref 30.0–36.0)
MCV: 88.3 fL (ref 80.0–100.0)
Platelets: 159 10*3/uL (ref 150–400)
RBC: 3.83 MIL/uL — ABNORMAL LOW (ref 4.22–5.81)
RDW: 13.2 % (ref 11.5–15.5)
WBC: 8.8 10*3/uL (ref 4.0–10.5)
nRBC: 0 % (ref 0.0–0.2)

## 2019-08-20 LAB — BLOOD PRODUCT ORDER (VERBAL) VERIFICATION

## 2019-08-20 LAB — SURGICAL PATHOLOGY

## 2019-08-20 MED ORDER — ENOXAPARIN SODIUM 30 MG/0.3ML ~~LOC~~ SOLN
30.0000 mg | Freq: Two times a day (BID) | SUBCUTANEOUS | Status: DC
  Administered 2019-08-20 – 2019-08-25 (×11): 30 mg via SUBCUTANEOUS
  Filled 2019-08-20 (×13): qty 0.3

## 2019-08-20 MED ORDER — ACETAMINOPHEN 325 MG PO TABS
650.0000 mg | ORAL_TABLET | Freq: Four times a day (QID) | ORAL | Status: DC
  Administered 2019-08-20 – 2019-08-25 (×20): 650 mg via ORAL
  Filled 2019-08-20 (×20): qty 2

## 2019-08-20 MED ORDER — METHOCARBAMOL 750 MG PO TABS
750.0000 mg | ORAL_TABLET | Freq: Three times a day (TID) | ORAL | Status: DC
  Administered 2019-08-20 – 2019-08-25 (×16): 750 mg via ORAL
  Filled 2019-08-20 (×16): qty 1

## 2019-08-20 MED ORDER — AMOXICILLIN-POT CLAVULANATE 875-125 MG PO TABS
1.0000 | ORAL_TABLET | Freq: Two times a day (BID) | ORAL | Status: DC
  Administered 2019-08-20 – 2019-08-22 (×4): 1 via ORAL
  Filled 2019-08-20 (×4): qty 1

## 2019-08-20 MED ORDER — INFLUENZA VAC SPLIT QUAD 0.5 ML IM SUSY
0.5000 mL | PREFILLED_SYRINGE | INTRAMUSCULAR | Status: DC
  Filled 2019-08-20: qty 0.5
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
## 2019-08-20 NOTE — Progress Notes (Signed)
RN had conversation with pts mother about visitor policy. Mother requesting to "give up my rights" to see pt so father could be the visitor. Father then called to ask for charge RN. RN spoke to father about visitor policy and that the mother is the assigned visitor. RN then went to talk to pt in which pt stated he would like to keep his mother as his visitor.
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
## 2019-08-22 ENCOUNTER — Inpatient Hospital Stay (HOSPITAL_COMMUNITY): Payer: Medicaid Other

## 2019-08-22 LAB — CBC
HCT: 35.9 % — ABNORMAL LOW (ref 39.0–52.0)
Hemoglobin: 12.3 g/dL — ABNORMAL LOW (ref 13.0–17.0)
MCH: 30.4 pg (ref 26.0–34.0)
MCHC: 34.3 g/dL (ref 30.0–36.0)
MCV: 88.6 fL (ref 80.0–100.0)
Platelets: 181 10*3/uL (ref 150–400)
RBC: 4.05 MIL/uL — ABNORMAL LOW (ref 4.22–5.81)
RDW: 12.9 % (ref 11.5–15.5)
WBC: 5.6 10*3/uL (ref 4.0–10.5)
nRBC: 0 % (ref 0.0–0.2)
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
## 2019-08-23 ENCOUNTER — Inpatient Hospital Stay (HOSPITAL_COMMUNITY): Payer: Medicaid Other

## 2019-08-23 MED ORDER — GABAPENTIN 100 MG PO CAPS
100.0000 mg | ORAL_CAPSULE | Freq: Three times a day (TID) | ORAL | Status: DC
  Administered 2019-08-23 – 2019-08-25 (×7): 100 mg via ORAL
  Filled 2019-08-23 (×6): qty 1

## 2019-08-23 MED ORDER — SODIUM CHLORIDE 0.9 % IV SOLN: INTRAVENOUS | Status: DC

## 2019-08-23 NOTE — Progress Notes (Signed)
Pt complained of left elbow pain and limited ROM at shoulder. ST upon any activity, denies shortness of breath, dizziness or any sense of elevated heart rate. Pt OOB to bathroom for first BM. PA rounded and reported patient concerns. Smaller FR CT removed by PA, occlusive dressing applied to removal site and reapplied to remaining CT. Patient tolerated well and heart rate dropped below 100. Orders received. Simmie Davies RN
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
## 2019-08-24 ENCOUNTER — Inpatient Hospital Stay (HOSPITAL_COMMUNITY): Payer: Medicaid Other
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
## 2019-08-25 ENCOUNTER — Inpatient Hospital Stay (HOSPITAL_COMMUNITY): Payer: Medicaid Other

## 2019-08-25 ENCOUNTER — Encounter: Payer: Self-pay | Admitting: Emergency Medicine

## 2019-08-25 MED ORDER — GABAPENTIN 100 MG PO CAPS: 200.0000 mg | ORAL_CAPSULE | Freq: Three times a day (TID) | ORAL | Status: DC

## 2019-08-25 MED ORDER — OXYCODONE HCL 5 MG PO TABS: 5.0000 mg | ORAL_TABLET | Freq: Four times a day (QID) | ORAL | 0 refills | Status: AC | PRN

## 2019-08-25 MED ORDER — ACETAMINOPHEN 325 MG PO TABS: 650.0000 mg | ORAL_TABLET | Freq: Four times a day (QID) | ORAL | Status: AC | PRN

## 2019-08-25 MED ORDER — GABAPENTIN 100 MG PO CAPS: 200.0000 mg | ORAL_CAPSULE | Freq: Three times a day (TID) | ORAL | 0 refills | Status: AC | PRN

## 2019-08-25 MED ORDER — DOCUSATE SODIUM 100 MG PO CAPS: 100.0000 mg | ORAL_CAPSULE | Freq: Two times a day (BID) | ORAL | 0 refills | Status: AC | PRN

## 2019-08-25 MED ORDER — METHOCARBAMOL 750 MG PO TABS: 750.0000 mg | ORAL_TABLET | Freq: Three times a day (TID) | ORAL | 0 refills | Status: AC | PRN
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
## 2019-09-10 ENCOUNTER — Other Ambulatory Visit: Payer: Self-pay | Admitting: Physician Assistant

## 2019-09-10 ENCOUNTER — Ambulatory Visit
Admission: RE | Admit: 2019-09-10 | Discharge: 2019-09-10 | Disposition: A | Discharge status: Home / Self Care | Payer: Medicaid Other | Source: Ambulatory Visit | Attending: Physician Assistant | Admitting: Physician Assistant

## 2019-09-10 DIAGNOSIS — J942 Hemothorax: Secondary | ICD-10-CM

## 2019-10-07 ENCOUNTER — Ambulatory Visit: Payer: Medicaid Other | Attending: Occupational Therapy | Admitting: Occupational Therapy

## 2020-04-18 ENCOUNTER — Emergency Department (HOSPITAL_COMMUNITY)
Admission: EM | Admit: 2020-04-18 | Discharge: 2020-04-18 | Disposition: A | Discharge status: Home / Self Care | Payer: Medicaid Other | Attending: Emergency Medicine | Admitting: Emergency Medicine

## 2020-04-18 ENCOUNTER — Other Ambulatory Visit: Payer: Self-pay

## 2020-04-18 ENCOUNTER — Encounter (HOSPITAL_COMMUNITY): Payer: Self-pay | Admitting: Emergency Medicine

## 2020-04-18 DIAGNOSIS — Z5321 Procedure and treatment not carried out due to patient leaving prior to being seen by health care provider: Secondary | ICD-10-CM | POA: Diagnosis not present

## 2020-04-18 DIAGNOSIS — J029 Acute pharyngitis, unspecified: Secondary | ICD-10-CM | POA: Insufficient documentation

## 2020-04-18 DIAGNOSIS — R4182 Altered mental status, unspecified: Secondary | ICD-10-CM | POA: Diagnosis present

## 2020-04-18 LAB — COMPREHENSIVE METABOLIC PANEL
ALT: 12 U/L (ref 0–44)
AST: 24 U/L (ref 15–41)
Albumin: 4.2 g/dL (ref 3.5–5.0)
Alkaline Phosphatase: 49 U/L (ref 38–126)
Anion gap: 11 (ref 5–15)
BUN: 10 mg/dL (ref 6–20)
CO2: 22 mmol/L (ref 22–32)
Calcium: 9.4 mg/dL (ref 8.9–10.3)
Chloride: 105 mmol/L (ref 98–111)
Creatinine, Ser: 0.92 mg/dL (ref 0.61–1.24)
GFR calc Af Amer: >60 mL/min (ref >60)
GFR calc non Af Amer: >60 mL/min (ref >60)
Glucose, Bld: 91 mg/dL (ref 70–99)
Potassium: 4.9 mmol/L (ref 3.5–5.1)
Sodium: 138 mmol/L (ref 135–145)
Total Bilirubin: 1.1 mg/dL (ref 0.3–1.2)
Total Protein: 7.1 g/dL (ref 6.5–8.1)

## 2020-04-18 LAB — CBC
HCT: 46.6 % (ref 39.0–52.0)
Hemoglobin: 15 g/dL (ref 13.0–17.0)
MCH: 29 pg (ref 26.0–34.0)
MCHC: 32.2 g/dL (ref 30.0–36.0)
MCV: 90 fL (ref 80.0–100.0)
Platelets: 241 10*3/uL (ref 150–400)
RBC: 5.18 MIL/uL (ref 4.22–5.81)
RDW: 11.8 % (ref 11.5–15.5)
WBC: 5 10*3/uL (ref 4.0–10.5)
nRBC: 0 % (ref 0.0–0.2)

## 2020-04-18 LAB — CBG MONITORING, ED: Glucose-Capillary: 65 mg/dL — ABNORMAL LOW (ref 70–99)
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
## 2022-06-23 ENCOUNTER — Encounter: Payer: Self-pay | Admitting: Internal Medicine
